# Patient Record
Sex: Male | Born: 1950 | Race: White | Hispanic: No | Marital: Married | Smoking: Never smoker
Health system: Southern US, Community
[De-identification: ages and names within clinical notes are randomized; demographics above are authoritative.]

## PROBLEM LIST (undated history)

## (undated) DIAGNOSIS — I4891 Unspecified atrial fibrillation: Secondary | ICD-10-CM

## (undated) DIAGNOSIS — E7800 Pure hypercholesterolemia, unspecified: Secondary | ICD-10-CM

## (undated) HISTORY — PX: HERNIA REPAIR: SHX51

## (undated) HISTORY — PX: CHOLECYSTECTOMY: SHX55

## (undated) HISTORY — PX: APPENDECTOMY: SHX54

---

## 2017-04-12 ENCOUNTER — Other Ambulatory Visit: Payer: Self-pay

## 2017-04-12 ENCOUNTER — Emergency Department (HOSPITAL_BASED_OUTPATIENT_CLINIC_OR_DEPARTMENT_OTHER): Payer: No Typology Code available for payment source

## 2017-04-12 ENCOUNTER — Emergency Department (HOSPITAL_BASED_OUTPATIENT_CLINIC_OR_DEPARTMENT_OTHER)
Admission: EM | Admit: 2017-04-12 | Discharge: 2017-04-13 | Disposition: A | Discharge status: Home / Self Care | Payer: No Typology Code available for payment source | Attending: Gastroenterology | Admitting: Gastroenterology

## 2017-04-12 ENCOUNTER — Encounter (HOSPITAL_BASED_OUTPATIENT_CLINIC_OR_DEPARTMENT_OTHER): Payer: Self-pay | Admitting: *Deleted

## 2017-04-12 DIAGNOSIS — E7800 Pure hypercholesterolemia, unspecified: Secondary | ICD-10-CM | POA: Insufficient documentation

## 2017-04-12 DIAGNOSIS — T18128A Food in esophagus causing other injury, initial encounter: Secondary | ICD-10-CM | POA: Diagnosis not present

## 2017-04-12 DIAGNOSIS — K222 Esophageal obstruction: Secondary | ICD-10-CM | POA: Diagnosis not present

## 2017-04-12 DIAGNOSIS — X58XXXA Exposure to other specified factors, initial encounter: Secondary | ICD-10-CM | POA: Diagnosis not present

## 2017-04-12 DIAGNOSIS — Z7901 Long term (current) use of anticoagulants: Secondary | ICD-10-CM | POA: Insufficient documentation

## 2017-04-12 DIAGNOSIS — K3189 Other diseases of stomach and duodenum: Secondary | ICD-10-CM | POA: Diagnosis not present

## 2017-04-12 DIAGNOSIS — I4891 Unspecified atrial fibrillation: Secondary | ICD-10-CM | POA: Diagnosis not present

## 2017-04-12 DIAGNOSIS — K210 Gastro-esophageal reflux disease with esophagitis: Secondary | ICD-10-CM | POA: Diagnosis not present

## 2017-04-12 HISTORY — DX: Unspecified atrial fibrillation: I48.91

## 2017-04-12 HISTORY — DX: Pure hypercholesterolemia, unspecified: E78.00

## 2017-04-12 MED ORDER — GLUCAGON HCL RDNA (DIAGNOSTIC) 1 MG IJ SOLR
1.0000 mg | Freq: Once | INTRAMUSCULAR | Status: AC
  Administered 2017-04-12: 1 mg via INTRAVENOUS
  Filled 2017-04-12: qty 1

## 2017-04-12 NOTE — ED Notes (Signed)
Patient stated that he had the same problem 2 years ago.  He had his esophagus stretch in [place].  Pt is from out of town.  He stated that when he drinks anything, it will come out right away.  Denies any pain.

## 2017-04-12 NOTE — ED Provider Notes (Signed)
[facility] EMERGENCY DEPARTMENT Provider Note   CSN: [id number] Arrival date & time: 04/12/17  2032     History   Chief Complaint Chief Complaint  Patient presents with  . Esophageal Stricture    HPI Cindee Lameroy Rorke is a 66 y.o. male.  HPI 66 year old Caucasian male past medical history is significant for atrial fibrillation currently on anticoagulation and history of food impactions sent to the ED for evaluation of food bolus.  The patient states that he was eating chicken approximately 2 hours ago when he took one bite and felt like it got stuck in his esophagus.  Patient states he has had a history of same.  Has had a food impaction 3 times.  He is required an EGD all 3 times to help dilate his esophagus.  Patient is currently from out of town.  States that he is not able to drinking being and he will throw it right back up.  States that he has been spitting up his saliva.  Patient denies any associated pain.  States he is on anticoagulation from his A. fib and takes it regularly.  Patient denies any other symptoms including difficulties breathing at this time.  Nothing makes his symptoms better or worse.  Patient is not taking for symptoms prior to arrival. Past Medical History:  Diagnosis Date  . Atrial fibrillation (HCC)   . High cholesterol     There are no active problems to display for this patient.   Past Surgical History:  Procedure Laterality Date  . APPENDECTOMY    . CHOLECYSTECTOMY    . HERNIA REPAIR         Home Medications    Prior to Admission medications   Medication Sig Start Date End Date Taking? Authorizing Provider  Apixaban (ELIQUIS PO) Take by mouth.   Yes [provider]  METOPROLOL SUCCINATE PO Take by mouth.   Yes [provider]    Family History No family history on file.  Social History Social History   Tobacco Use  . Smoking status: Never Smoker  . Smokeless tobacco: Never Used  Substance Use Topics  .  Alcohol use: No    Frequency: Never  . Drug use: No     Allergies   Erythromycin   Review of Systems Review of Systems  Constitutional: Negative for chills and fever.  HENT: Positive for trouble swallowing.   Respiratory: Negative for cough and shortness of breath.   Cardiovascular: Negative for chest pain.  Gastrointestinal: Negative for nausea and vomiting.  Musculoskeletal: Negative for myalgias.  Skin: Negative for color change.  Neurological: Negative for headaches.     Physical Exam Updated Vital Signs BP (!) 152/87   Pulse 71   Temp 99 F (37.2 C) (Oral)   Resp 18   Ht 5\' 6"  (1.676 m)   Wt 79.4 kg (175 lb)   SpO2 99%   BMI 28.25 kg/m   Physical Exam  Constitutional: He is oriented to person, place, and time. He appears well-developed and well-nourished.  Non-toxic appearance. No distress.  HENT:  Head: Normocephalic and atraumatic.  Mouth/Throat: Oropharynx is clear and moist.  Patient is speaking complete sentences.  He is spitting up his saliva.  Appears to be in no respiratory distress.  Eyes: Conjunctivae are normal. Pupils are equal, round, and reactive to light. Right eye exhibits no discharge. Left eye exhibits no discharge.  Neck: Normal range of motion. Neck supple.  Cardiovascular: Normal rate, regular rhythm and intact distal  pulses.  Pulmonary/Chest: Effort normal and breath sounds normal. No stridor. No respiratory distress. He has no wheezes. He has no rales. He exhibits no tenderness.  Abdominal: Soft. Bowel sounds are normal. He exhibits no distension. There is no tenderness. There is no rebound and no guarding.  Musculoskeletal: Normal range of motion. He exhibits no tenderness.  Lymphadenopathy:    He has no cervical adenopathy.  Neurological: He is alert and oriented to person, place, and time.  Skin: Skin is warm and dry. Capillary refill takes less than 2 seconds. No rash noted.  Psychiatric: His behavior is normal. Judgment and thought  content normal.  Nursing note and vitals reviewed.    ED Treatments / Results  Labs (all labs ordered are listed, but only abnormal results are displayed) Labs Reviewed - No data to display  EKG  EKG Interpretation None       Radiology Dg Chest 2 View  Result Date: 04/12/2017 CLINICAL DATA:  Wound bolus stuck in esophagus unable to drink, history of esophageal stricture EXAM: CHEST  2 VIEW COMPARISON:  None. FINDINGS: No acute consolidation or effusion. Normal cardiomediastinal silhouette. No pneumothorax. No radiopaque foreign body. Surgical clips in the right upper quadrant. IMPRESSION: No active cardiopulmonary disease. Electronically Signed   By: Jasmine PangKim  Fujinaga M.D.   On: 04/12/2017 22:19    Procedures Procedures (including critical care time)  Medications Ordered in ED Medications  glucagon (human recombinant) (GLUCAGEN) injection 1 mg (not administered)     Initial Impression / Assessment and Plan / ED Course  I have reviewed the triage vital signs and the nursing notes.  Pertinent labs & imaging results that were available during my care of the patient were reviewed by me and considered in my medical decision making (see chart for details).     Patient resents to the ED with food impaction.  History of same.  Patient does not appear to be in any acute distress.  Vital signs are reassuring.  Breath sounds are normal.  Patient is able to tolerate most of his secretions however p.o. fluids he is throwing back up.  Chest x-ray is unremarkable.  I did try glucagon without any relief.  Spoke with Dr. Russella DarStark with gastroenterology who states that patient will need an endoscopy.  [facility] will [facility] by POV with IV in place.  Dr. Russella DarStark is asked to be notified when patient gets to the ED to be taken to the endoscopy suite.  Patient is on Eliquis for atrial fibrillation.  Did speak with gastroenterology concerning this but felt like it was  reasonable to perform endoscopy this evening.  Stable at this time.  Have discussed disposition with patient.  Will be driven by family member over to the ER.  Patient was seen and evaluated by attending Dr. Verdie MosherLiu who was agreeable the above plan.  Final Clinical Impressions(s) / ED Diagnoses   Final diagnoses:  Food impaction of esophagus, initial encounter    ED Discharge Orders    None       Wallace KellerLeaphart, Aislinn Feliz T, PA-C 04/12/17 2342    Lavera GuiseLiu, Dana Duo, MD 04/13/17 [id number]

## 2017-04-12 NOTE — ED Triage Notes (Signed)
Eating chicken and it got stuck in his esophagus. Hx of same. He had it stretched. He takes Eliquist for Atrial fib.

## 2017-04-12 NOTE — ED Provider Notes (Signed)
Medical screening examination/treatment/procedure(s) were conducted as a shared visit with non-physician practitioner(s) and myself.  I personally evaluated the patient during the encounter.   EKG Interpretation None      66 year old male who presents with esophageal food impaction.  History of esophageal strictures requiring dilation.  [place] is visiting from out of town in [place].  Last had dilation of his stricture 2 years ago by his GI doctor.  Was eating chicken tonight and had recurrent food impaction.  No difficulty breathing, but is spitting up his saliva.  Take Eliquis for atrial fibrillation.  Nontoxic in no acute distress with stable vital signs.  Soft benign abdomen.  No respiratory distress but occasionally displayed up the saliva.  Unable to pass food impaction with glucagon. GI was consulted, recommending transfer to [facility] ED for endoscopy. Patient to be transferred, he prefers private vehicle.    Lavera GuiseLiu, Crespin Forstrom Duo, MD 04/12/17 [id number]

## 2017-04-12 NOTE — ED Notes (Signed)
ED Provider at bedside. 

## 2017-04-13 ENCOUNTER — Encounter (HOSPITAL_COMMUNITY): Payer: Self-pay

## 2017-04-13 ENCOUNTER — Encounter (HOSPITAL_COMMUNITY)
Admission: EM | Disposition: A | Discharge status: Home / Self Care | Payer: Self-pay | Source: Home / Self Care | Attending: Emergency Medicine

## 2017-04-13 DIAGNOSIS — K222 Esophageal obstruction: Secondary | ICD-10-CM | POA: Diagnosis not present

## 2017-04-13 DIAGNOSIS — T18128A Food in esophagus causing other injury, initial encounter: Secondary | ICD-10-CM | POA: Diagnosis not present

## 2017-04-13 DIAGNOSIS — K3189 Other diseases of stomach and duodenum: Secondary | ICD-10-CM | POA: Diagnosis not present

## 2017-04-13 DIAGNOSIS — K449 Diaphragmatic hernia without obstruction or gangrene: Secondary | ICD-10-CM | POA: Diagnosis not present

## 2017-04-13 DIAGNOSIS — K210 Gastro-esophageal reflux disease with esophagitis: Secondary | ICD-10-CM

## 2017-04-13 HISTORY — PX: ESOPHAGOGASTRODUODENOSCOPY: SHX5428

## 2017-04-13 SURGERY — EGD (ESOPHAGOGASTRODUODENOSCOPY)
Anesthesia: Moderate Sedation

## 2017-04-13 MED ORDER — MIDAZOLAM HCL 5 MG/ML IJ SOLN
INTRAMUSCULAR | Status: AC
  Filled 2017-04-13: qty 2

## 2017-04-13 MED ORDER — DIPHENHYDRAMINE HCL 50 MG/ML IJ SOLN
INTRAMUSCULAR | Status: DC | PRN
Start: 2017-04-13 — End: 2017-04-13
  Administered 2017-04-13: 25 mg via INTRAVENOUS

## 2017-04-13 MED ORDER — DIPHENHYDRAMINE HCL 50 MG/ML IJ SOLN
INTRAMUSCULAR | Status: AC
  Filled 2017-04-13: qty 1

## 2017-04-13 MED ORDER — FENTANYL CITRATE (PF) 100 MCG/2ML IJ SOLN
INTRAMUSCULAR | Status: DC | PRN
  Administered 2017-04-13 (×3): 25 ug via INTRAVENOUS

## 2017-04-13 MED ORDER — MIDAZOLAM HCL 10 MG/2ML IJ SOLN
INTRAMUSCULAR | Status: DC | PRN
  Administered 2017-04-13: 2 mg via INTRAVENOUS
  Administered 2017-04-13: 1 mg via INTRAVENOUS
  Administered 2017-04-13: 2 mg via INTRAVENOUS

## 2017-04-13 MED ORDER — FENTANYL CITRATE (PF) 100 MCG/2ML IJ SOLN
INTRAMUSCULAR | Status: AC
  Filled 2017-04-13: qty 2

## 2017-04-13 NOTE — Op Note (Signed)
[facility] Patient Name: Aaron Duke Procedure Date: 04/13/2017 MRN: [id number] Attending MD: Meryl DareMalcolm T Stark , MD Date of Birth: 1950-07-21 CSN: [id number] Age: [age] Admit Type: Emergency Department Procedure:                Upper GI endoscopy Indications:              Dysphagia, Foreign body in the esophagus. Acute                            dysphagia while eating chicken this evening. Unable                            to handle secretions. History of esophageal                            stricture. Providers:                Venita LickMalcolm T. Russella DarStark, MD, Omelia BlackwaterShelby Carpenter RN, RN, Zoila ShutterGary                            Bryant, Technician Referring MD:             Devoria AlbeIva Knapp, MD Medicines:                Fentanyl 75 micrograms IV, Midazolam 5 mg IV,                            Diphenhydramine 25 mg IV Complications:            No immediate complications. Estimated Blood Loss:     Estimated blood loss: none. Procedure:                Pre-Anesthesia Assessment:                           - Prior to the procedure, a History and Physical                            was performed, and patient medications and                            allergies were reviewed. The patient's tolerance of                            previous anesthesia was also reviewed. The risks                            and benefits of the procedure and the sedation                            options and risks were discussed with the patient.                            All questions were answered, and informed consent  was obtained. Prior Anticoagulants: The patient has                            taken Eliquis (apixaban), last dose was 1 day prior                            to procedure. ASA Grade Assessment: III - A patient                            with severe systemic disease. After reviewing the                            risks and benefits, the patient was deemed in   satisfactory condition to undergo the procedure.                           After obtaining informed consent, the endoscope was                            passed under direct vision. Throughout the                            procedure, the patient's blood pressure, pulse, and                            oxygen saturations were monitored continuously. The                            Endoscope was introduced through the mouth, and                            advanced to the esophageal stricture. The [id number]                            ([id number]) peds endoscope was introduced through the                            mouth and advanced to the second part of the                            duodenum. The upper GI endoscopy was accomplished                            without difficulty. The patient tolerated the                            procedure well. Scope In: Scope Out: Findings:      Food was found in the mid esophagus. Removal of food was accomplished by       removing a portion and then the remainder past spontaneously.      One moderate benign-appearing, intrinsic stenosis was found 30 cm from       the incisors. This measured 8 mm (inner diameter) and was traversed       after  downsizing scope.      LA Grade A (one or more mucosal breaks less than 5 mm, not extending       between tops of 2 mucosal folds) esophagitis with no bleeding was found       at the gastroesophageal junction.      Mucosal changes including ringed esophagus, longitudinal furrows,       small-caliber esophagus and white plaques were found in the mid       esophagus and in the distal esophagus.      The exam of the esophagus was otherwise normal.      A small hiatal hernia was present.      The exam of the stomach was otherwise normal.      Patchy mildly erythematous mucosa without active bleeding and with no       stigmata of bleeding was found in the duodenal bulb.      The second portion of the duodenum was normal.       - No biopsies or dilation performed due to short duration of Eliquis       hold. Impression:               - Food in the mid esophagus. Removal was successful.                           - Benign-appearing esophageal stenosis.                           - LA Grade A reflux esophagitis.                           - Esophageal mucosal changes suggestive of                            eosinophilic esophagitis.                           - Small hiatal hernia.                           - Erythematous duodenopathy.                           - Normal second portion of the duodenum. Moderate Sedation:      Moderate (conscious) sedation was administered by the endoscopy nurse       and supervised by the endoscopist. The following parameters were       monitored: oxygen saturation, heart rate, blood pressure, respiratory       rate, EKG, adequacy of pulmonary ventilation, and response to care.       Total physician intraservice time was 18 minutes. Recommendation:           - Patient has a contact number available for                            emergencies. The signs and symptoms of potential                            delayed complications were discussed with the  patient. Return to normal activities tomorrow.                            Written discharge instructions were provided to the                            patient.                           - Full liquid diet today, then advance as tolerated                            to soft diet until cleared by your                            gastroenterologist to advance.                           - No meats, bread, peanut butter.                           - Continue present medications.                           - Prilosec (omeprazole) 20 mg PO daily.                           - Return to GI office at the next available                            appointment.                           - Resume Eliquis (apixaban) at prior  dose today.                            Refer to managing physician for further adjustment                            of therapy. Procedure Code(s):        --- Professional ---                           [phone], Esophagogastroduodenoscopy, flexible,                            transoral; with removal of foreign body(s)                           99152, Moderate sedation services provided by the                            same physician or other qualified health care                            professional performing the diagnostic or  therapeutic service that the sedation supports,                            requiring the presence of an independent trained                            observer to assist in the monitoring of the                            patient's level of consciousness and physiological                            status; initial 15 minutes of intraservice time,                            patient age 31 years or older Diagnosis Code(s):        --- Professional ---                           [phone], Food in esophagus causing other injury,                            initial encounter                           K22.2, Esophageal obstruction                           K21.0, Gastro-esophageal reflux disease with                            esophagitis                           K44.9, Diaphragmatic hernia without obstruction or                            gangrene                           K31.89, Other diseases of stomach and duodenum                           R13.10, Dysphagia, unspecified                           T18.108A, Unspecified foreign body in esophagus                            causing other injury, initial encounter CPT copyright 2016 [employer] Association. All rights reserved. The codes documented in this report are preliminary and upon coder review may  be revised to meet current compliance requirements. Meryl Dare, MD 04/13/2017  2:58:21 AM This report has been signed electronically. Number of Addenda: 0

## 2017-04-13 NOTE — ED Notes (Signed)
Endoscopy Team at bedside.  

## 2017-04-13 NOTE — ED Notes (Signed)
Pt arrived [facility] via POV for further treatment of food impaction in esophagus.  Pt denies pain or shortness of breath.  Pt is observed frequently spitting out--- stated that he is unable to take anything down.

## 2017-04-13 NOTE — ED Notes (Signed)
Patient is transferred to [facility] ED per POV.  Report given to charge nurse, Darel HongJudy.   Peripheral IV secured and paperworks given to patient's family.

## 2017-04-13 NOTE — ED Notes (Signed)
Pt was given bottled water for po/fluid challenge---- tolerated well.

## 2017-04-13 NOTE — ED Provider Notes (Signed)
12:55 AM Patient transferred from [facility] with acute esophageal stricture.  He states he had similar about 2 years ago.  Patient is visiting from [place].  He states tonight about 6 PM after trying to eat chicken and he felt like it got stuck.  He states since then he is unable to swallow his own saliva.  He states they [place] [facility] [place] and he immediately vomited it up.  He denies any respiratory difficulty.  Patient is on Eliquis for atrial fibrillation, however he states the last time he was in it was couple weeks ago.  Patient is noted to be in sinus rhythm on his cardiac monitor.  Review of his chart shows they had spoken to Dr. Russella DarStark, gastroenterologist.  I re-paged him to let him know patient has arrived in our ED.  01:25 AM Secretary states Dr Russella DarStark has been notified this patient is in the ED    Devoria AlbeKnapp, Aaron Wildey, MD 04/13/17 [phone]

## 2017-04-13 NOTE — H&P (Signed)
History              Chief Complaint    Chief Complaint  Patient presents with  . Esophageal Stricture    HPI Aaron Duke is a 66 y.o. male.  HPI 66 year old Caucasian male past medical history is significant for atrial fibrillation currently on anticoagulation and history of food impactions sent to the ED for evaluation of food bolus.  The patient states that he was eating chicken approximately 2 hours ago when he took one bite and felt like it got stuck in his esophagus.  Patient states he has had a history of same.  Has had a food impaction 3 times.  He is required an EGD all 3 times to help dilate his esophagus.  Patient is currently from out of town.  States that he is not able to drinking being and he will throw it right back up.  States that he has been spitting up his saliva.  Patient denies any associated pain.  States he is on anticoagulation from his A. fib and takes it regularly.  Patient denies any other symptoms including difficulties breathing at this time.  Nothing makes his symptoms better or worse.  Patient is not taking for symptoms prior to arrival.      Past Medical History:  Diagnosis Date  . Atrial fibrillation (HCC)   . High cholesterol     There are no active problems to display for this patient.        Past Surgical History:  Procedure Laterality Date  . APPENDECTOMY    . CHOLECYSTECTOMY    . HERNIA REPAIR        Home Medications                      Prior to Admission medications   Medication Sig Start Date End Date Taking? Authorizing Provider  Apixaban (ELIQUIS PO) Take by mouth.   Yes [provider]  METOPROLOL SUCCINATE PO Take by mouth.   Yes [provider]    Family History No family history on file.  Social History Social History        Tobacco Use  . Smoking status: Never Smoker  . Smokeless tobacco: Never Used  Substance Use Topics  . Alcohol use: No    Frequency: Never  .  Drug use: No     Allergies              Erythromycin   Review of Systems Review of Systems  Constitutional: Negative for chills and fever.  HENT: Positive for trouble swallowing.   Respiratory: Negative for cough and shortness of breath.   Cardiovascular: Negative for chest pain.  Gastrointestinal: Negative for nausea and vomiting.  Musculoskeletal: Negative for myalgias.  Skin: Negative for color change.  Neurological: Negative for headaches.     Physical Exam Updated Vital Signs BP (!) 152/87   Pulse 71   Temp 99 F (37.2 C) (Oral)   Resp 18   Ht 5\' 6"  (1.676 m)   Wt 79.4 kg (175 lb)   SpO2 99%   BMI 28.25 kg/m   Physical Exam  Constitutional: He is oriented to person, place, and time. He appears well-developed and well-nourished.  Non-toxic appearance. No distress.  HENT:  Head: Normocephalic and atraumatic.  Mouth/Throat: Oropharynx is clear and moist.  Patient is speaking complete sentences.  He is spitting up his saliva.  Appears to be in no respiratory distress.  Eyes: Conjunctivae are normal.  Pupils are equal, round, and reactive to light. Right eye exhibits no discharge. Left eye exhibits no discharge.  Neck: Normal range of motion. Neck supple.  Cardiovascular: Normal rate, regular rhythm and intact distal pulses.  Pulmonary/Chest: Effort normal and breath sounds normal. No stridor. No respiratory distress. He has no wheezes. He has no rales. He exhibits no tenderness.  Abdominal: Soft. Bowel sounds are normal. He exhibits no distension. There is no tenderness. There is no rebound and no guarding.  Musculoskeletal: Normal range of motion. He exhibits no tenderness.  Lymphadenopathy:    He has no cervical adenopathy.  Neurological: He is alert and oriented to person, place, and time.  Skin: Skin is warm and dry. Capillary refill takes less than 2 seconds. No rash noted.  Psychiatric: His behavior is normal. Judgment and thought content normal.   Nursing note and vitals reviewed.    ED Treatments / Results  Labs (all labs ordered are listed, but only abnormal results are displayed) Labs Reviewed - No data to display  EKG      EKG Interpretation None       Radiology  ImagingResults(Last48hours)  Dg Chest 2 View  Result Date: 04/12/2017 CLINICAL DATA:  Wound bolus stuck in esophagus unable to drink, history of esophageal stricture EXAM: CHEST  2 VIEW COMPARISON:  None. FINDINGS: No acute consolidation or effusion. Normal cardiomediastinal silhouette. No pneumothorax. No radiopaque foreign body. Surgical clips in the right upper quadrant. IMPRESSION: No active cardiopulmonary disease. Electronically Signed   By: Jasmine PangKim  Fujinaga M.D.   On: 04/12/2017 22:19     Procedures Procedures (including critical care time)  Medications Ordered in ED Medications  glucagon (human recombinant) (GLUCAGEN) injection 1 mg (not administered)     Initial Impression / Assessment and Plan / ED Course  I have reviewed the triage vital signs and the nursing notes.  Pertinent labs & imaging results that were available during my care of the patient were reviewed by me and considered in my medical decision making (see chart for details).   Patient resents to the ED with food impaction.  History of same.  Patient does not appear to be in any acute distress.  Vital signs are reassuring.  Breath sounds are normal.  Patient is able to tolerate most of his secretions however p.o. fluids he is throwing back up.  Chest x-ray is unremarkable.  I did try glucagon without any relief.  Spoke with Dr. Russella DarStark with gastroenterology who states that patient will need an endoscopy.  Patient will be [facility] emergency room by POV with IV in place.  Dr. Russella DarStark is asked to be notified when patient gets to the ED to be taken to the endoscopy suite.  Patient is on Eliquis for atrial fibrillation.  Did speak with gastroenterology  concerning this but felt like it was reasonable to perform endoscopy this evening.  Stable at this time.  Have discussed disposition with patient.  Will be driven by family member over to the ER.  Patient was seen and evaluated by attending Dr. Verdie MosherLiu who was agreeable the above plan.

## 2017-04-13 NOTE — ED Notes (Addendum)
Per Endoscopy Team:  Pt was given Fentany 75 mcg IV, Versed 5 mg IV and Benadryl 25 mg IV.  A piece of chicken was successfully removed from his esophagus.  Pt is now okay to be discharged as soon as his ride (family member) is here to pick him up, and he is fully awake.

## 2017-04-15 ENCOUNTER — Encounter (HOSPITAL_COMMUNITY): Payer: Self-pay | Admitting: Gastroenterology

## 2019-04-12 IMAGING — DX DG CHEST 2V
2 series · 2 of 2 positions shown · non-contrast
Comparison: None.

CLINICAL DATA: Wound bolus stuck in esophagus unable to drink,
history of esophageal stricture

EXAM:
CHEST  2 VIEW

[chest pa]
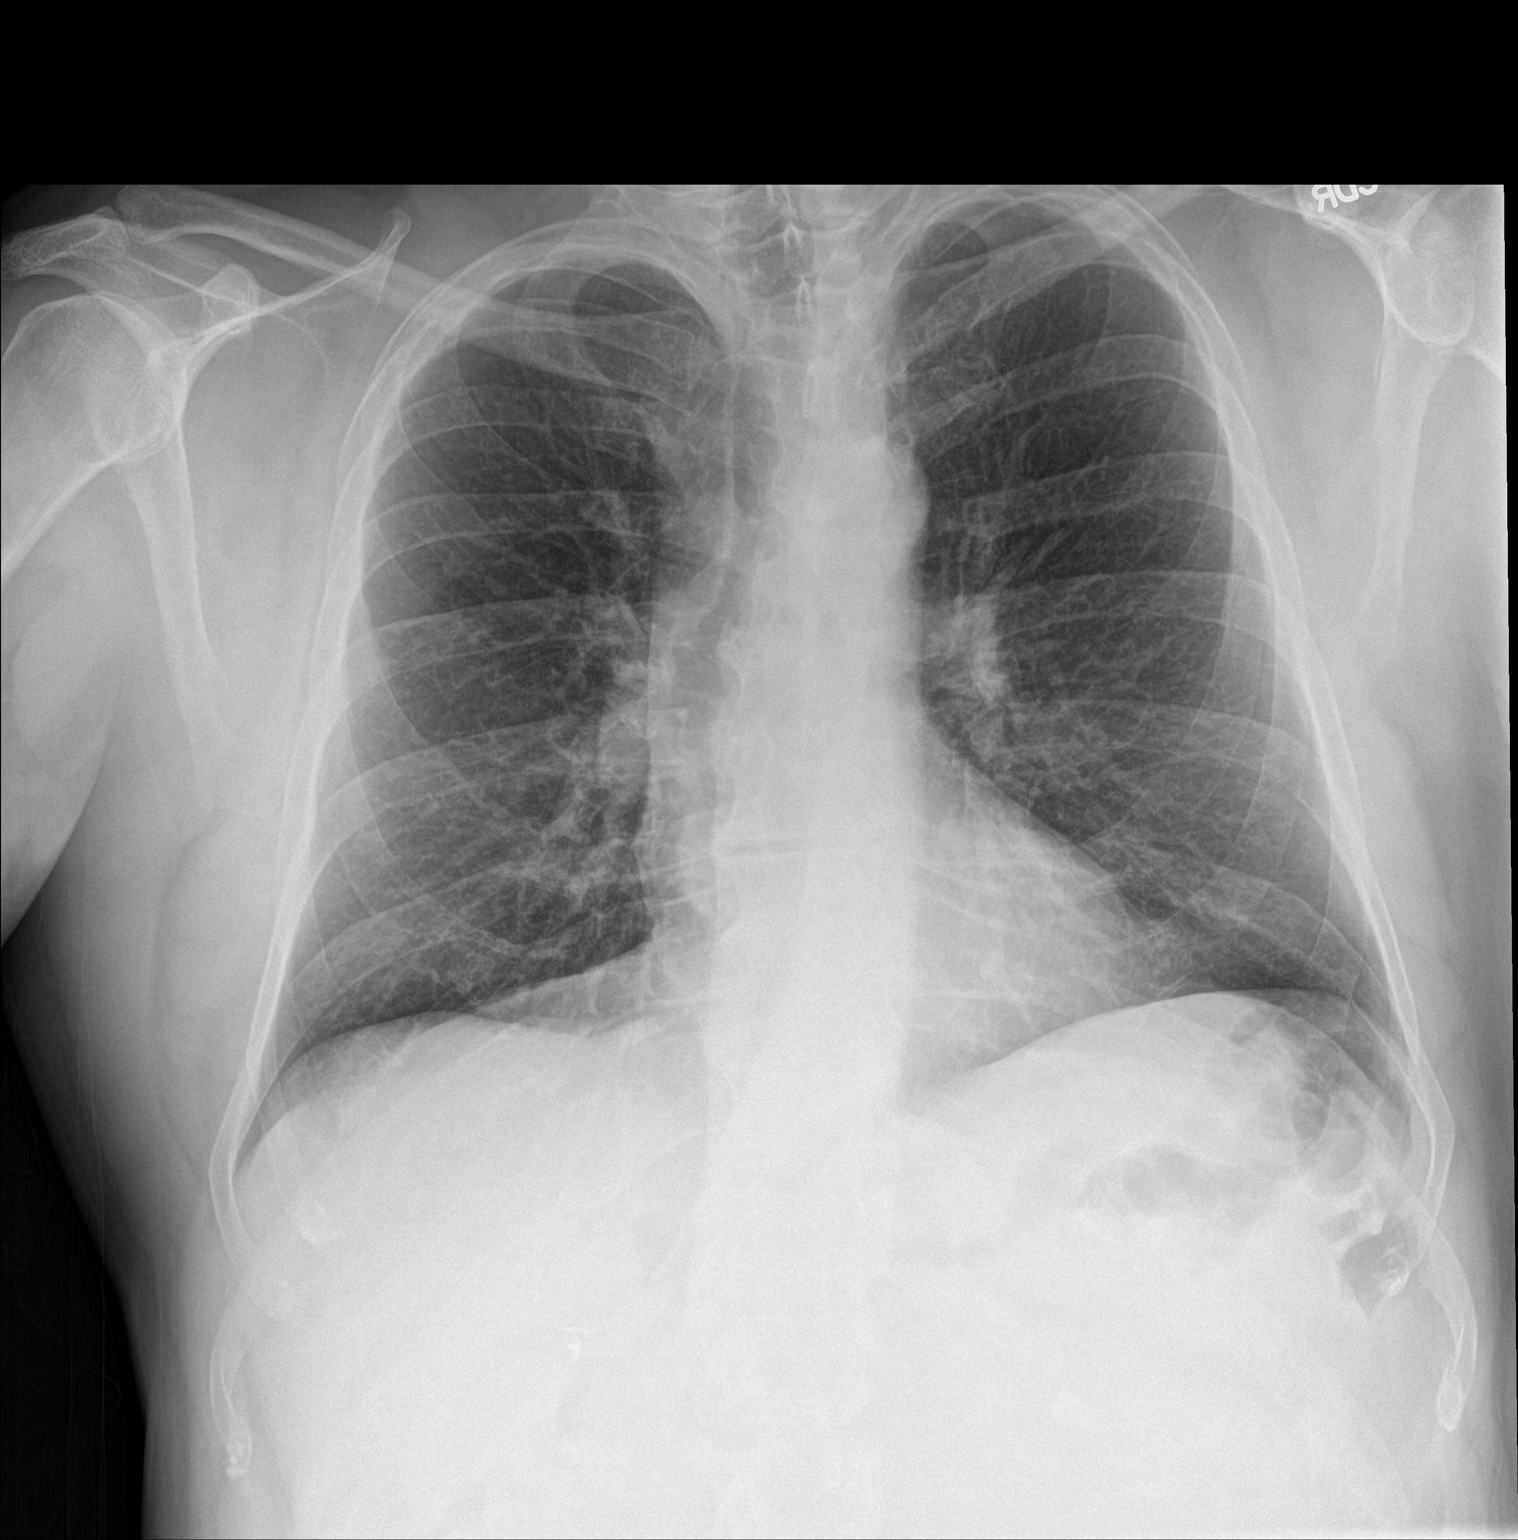

[chest lat]
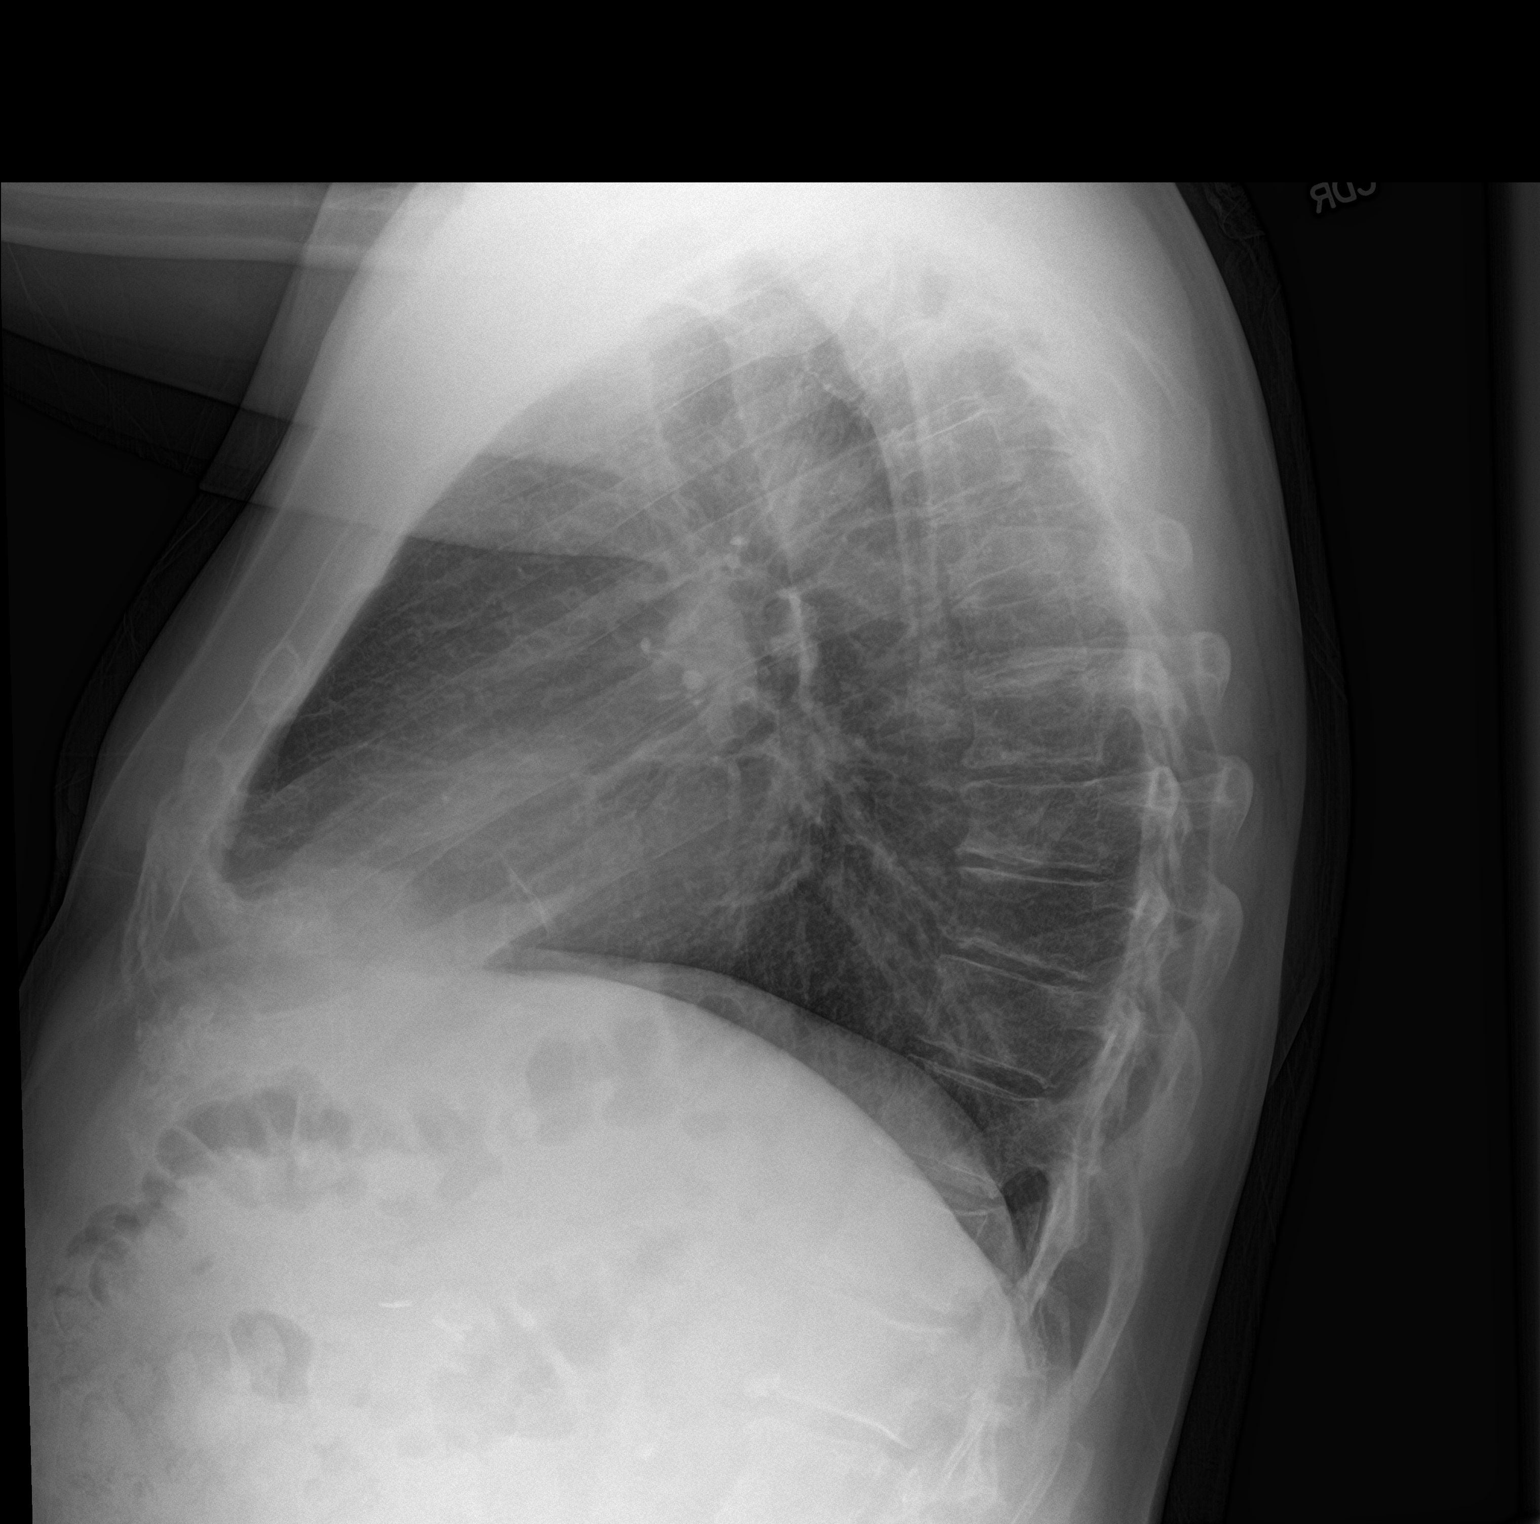

[2 of 2 positions shown; findings below may reference images not displayed]

FINDINGS: No acute consolidation or effusion. Normal cardiomediastinal
silhouette. No pneumothorax. No radiopaque foreign body. Surgical
clips in the right upper quadrant.
IMPRESSION: No active cardiopulmonary disease.
